# Patient Record
Sex: Male | Born: 1985 | Race: White | Hispanic: No | Marital: Married | Smoking: Never smoker
Health system: Southern US, Community
[De-identification: ages and names within clinical notes are randomized; demographics above are authoritative.]

---

## 2015-12-19 ENCOUNTER — Emergency Department (HOSPITAL_COMMUNITY): Payer: Worker's Compensation

## 2015-12-19 ENCOUNTER — Encounter (HOSPITAL_COMMUNITY): Payer: Self-pay

## 2015-12-19 ENCOUNTER — Emergency Department (HOSPITAL_COMMUNITY)
Admission: EM | Admit: 2015-12-19 | Discharge: 2015-12-19 | Disposition: A | Discharge status: Home / Self Care | Payer: Worker's Compensation | Attending: Emergency Medicine | Admitting: Emergency Medicine

## 2015-12-19 DIAGNOSIS — Y9389 Activity, other specified: Secondary | ICD-10-CM | POA: Insufficient documentation

## 2015-12-19 DIAGNOSIS — S99922A Unspecified injury of left foot, initial encounter: Secondary | ICD-10-CM | POA: Diagnosis present

## 2015-12-19 DIAGNOSIS — S8292XA Unspecified fracture of left lower leg, initial encounter for closed fracture: Secondary | ICD-10-CM

## 2015-12-19 DIAGNOSIS — R52 Pain, unspecified: Secondary | ICD-10-CM

## 2015-12-19 DIAGNOSIS — Y9269 Other specified industrial and construction area as the place of occurrence of the external cause: Secondary | ICD-10-CM | POA: Diagnosis not present

## 2015-12-19 DIAGNOSIS — S92122A Displaced fracture of body of left talus, initial encounter for closed fracture: Secondary | ICD-10-CM | POA: Insufficient documentation

## 2015-12-19 DIAGNOSIS — Y990 Civilian activity done for income or pay: Secondary | ICD-10-CM | POA: Insufficient documentation

## 2015-12-19 DIAGNOSIS — S92252A Displaced fracture of navicular [scaphoid] of left foot, initial encounter for closed fracture: Secondary | ICD-10-CM | POA: Diagnosis not present

## 2015-12-19 MED ORDER — ONDANSETRON HCL 4 MG/2ML IJ SOLN
4.0000 mg | Freq: Once | INTRAMUSCULAR | Status: AC
  Administered 2015-12-19: 4 mg via INTRAVENOUS
  Filled 2015-12-19: qty 2

## 2015-12-19 MED ORDER — OXYCODONE-ACETAMINOPHEN 5-325 MG PO TABS: 1.0000 | ORAL_TABLET | Freq: Three times a day (TID) | ORAL | 0 refills | Status: AC | PRN

## 2015-12-19 MED ORDER — OXYCODONE-ACETAMINOPHEN 5-325 MG PO TABS
1.0000 | ORAL_TABLET | Freq: Once | ORAL | Status: AC
  Administered 2015-12-19: 1 via ORAL
  Filled 2015-12-19: qty 1

## 2015-12-19 MED ORDER — FENTANYL CITRATE (PF) 100 MCG/2ML IJ SOLN
100.0000 ug | Freq: Once | INTRAMUSCULAR | Status: AC
  Administered 2015-12-19: 100 ug via INTRAVENOUS
  Filled 2015-12-19: qty 2

## 2015-12-19 MED ORDER — OXYCODONE-ACETAMINOPHEN 5-325 MG PO TABS
2.0000 | ORAL_TABLET | Freq: Once | ORAL | Status: AC
  Administered 2015-12-19: 2 via ORAL
  Filled 2015-12-19: qty 2

## 2015-12-19 MED ORDER — DIAZEPAM 5 MG/ML IJ SOLN
2.5000 mg | Freq: Once | INTRAMUSCULAR | Status: AC
  Administered 2015-12-19: 2.5 mg via INTRAVENOUS
  Filled 2015-12-19: qty 2

## 2015-12-19 NOTE — ED Provider Notes (Signed)
CT scan reviewed by Dr.Swintek. Requests posterior short-leg splint, crutches, nonweightbearing. Follow up with Dr. Victorino DikeHewitt in one week. I'll write prescription for Percocet. Posterior short leg splint applied by orthopedic technician is comfortable for patient. He'll get Percocet immediately prior to discharge. No results found for this or any previous visit. Dg Tibia/fibula Left  Result Date: 12/19/2015 CLINICAL DATA:  30 y/o  M; ankle injury with pain and swelling. EXAM: LEFT TIBIA AND FIBULA - 2 VIEW COMPARISON:  None. FINDINGS: There is no evidence of fracture of the tibia or fibula. The knee joint is maintained. Soft tissues are unremarkable. IMPRESSION: Negative. Electronically Signed   By: Mitzi HansenLance  Furusawa-Stratton M.D.   On: 12/19/2015 14:35   Ct Foot Left Wo Contrast  Result Date: 12/19/2015 CLINICAL DATA:  Evaluate talus fracture. Foot run over by a Bobcat tractor EXAM: CT OF THE LEFT FOOT WITHOUT CONTRAST TECHNIQUE: Multidetector CT imaging of the left foot was performed according to the standard protocol. Multiplanar CT image reconstructions were also generated. COMPARISON:  Radiographs same date. FINDINGS: Bones/Joint/Cartilage There is a relatively nondisplaced and slightly comminuted fracture involving the medial aspect of the posterior talar facet. There is also a mildly displaced avulsion-type fracture involving the medial aspect of the head of the talus. This does involve the talonavicular joint. Small comminuted fractures involving the inferior and medial most aspect of the navicular bone. Small fracture involving the medial and inferior aspect of the cuboid. The calcaneocuboid joint is intact. The middle and lateral cuneiforms are intact. No definite calcaneal fractures. The talocalcaneal facets are intact. Benign-appearing bone island in the first metatarsal. Ligaments Suboptimally assessed by CT. No obvious disruption of Lisfranc's ligament. Muscles and Tendons Grossly normal. Soft  tissues Moderate subcutaneous soft tissue swelling/ edema associated with the fractures. IMPRESSION: 1. Proximal and distal medial talar fractures. 2. Small comminuted fractures involving the medial and inferior aspect of the navicular bone. 3. Small fracture involving the medial and inferior aspect of the cuboid. 4. No fractures of the tibia or fibula and the calcaneus is intact. Electronically Signed   By: Rudie MeyerP.  Gallerani M.D.   On: 12/19/2015 16:50   Dg Foot Complete Left  Result Date: 12/19/2015 CLINICAL DATA:  30 y/o  M; ankle injury with pain and swelling. EXAM: LEFT FOOT - COMPLETE 3+ VIEW COMPARISON:  None. FINDINGS: Mildly impacted fracture of the medial head of the talus and possible fracture of the anterior articular process of calcaneus. Soft tissue swelling about the mid foot and ankle. Lisfranc alignment is preserved on these nonstress views. IMPRESSION: Mildly impacted fracture medial talus head and possible fracture of the anterior process of calcaneus. Electronically Signed   By: Mitzi HansenLance  Furusawa-Stratton M.D.   On: 12/19/2015 14:39   Diagnosis closed fracture of left lower extremity   Doug SouSam Anupama Piehl, MD 12/19/15 1758

## 2015-12-19 NOTE — Progress Notes (Signed)
Orthopedic Tech Progress Note Patient Details:  Jerry Blackburn 1985-11-14 [id number]  Ortho Devices Type of Ortho Device: Ace wrap, Crutches, Post (short leg) splint, Stirrup splint Ortho Device/Splint Location: LLE Ortho Device/Splint Interventions: Ordered, Application   Jennye MoccasinHughes, Khiry Pasquariello Craig 12/19/2015, 6:02 PM

## 2015-12-19 NOTE — ED Provider Notes (Signed)
Patient reports a "Bobcat" tractor ran over his left foot immediately prior to arrival. Complains of left foot pain no other injury. On exam he is alert no distress left lower extremity skin intact. Tender over ankle. Foot is nontender. DP pulse 2+. Good capillary refill. X-rays viewed by me. Last oral intake 6:30 am today   Doug SouSam Saba Ostrand, MD 12/19/15 1549

## 2015-12-19 NOTE — ED Provider Notes (Signed)
[facility]-EMERGENCY DEPT Provider Note   CSN: [id number] Arrival date & time: 12/19/15  1317     History   Chief Complaint Chief Complaint  Patient presents with  . Ankle Injury    HPI Jerry Blackburn is a 30 y.o. male.  HPI   Patient is a 30 year old male, otherwise healthy, who presents emergency Department with a left foot and ankle injury that occurred just prior to arrival. 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 He was unable to ambulate, did not attempt to bare weight, his boot was cut off, no deformity, no laceration. He reports pain and swelling to his medial left foot and ankle, which has gradually worsened, currently pain rated 8/10, described as throbbing. Pain was relatively improved with fentanyl, given in route by EMS. Other treatments attempted prior to arrival. He arrived with a soft foam splint on his left ankle and foot. He denies any numbness or tingling. States he can feel and move his toes.  Patient denies any other injuries or acute complaints.  History reviewed. No pertinent past medical history.  There are no active problems to display for this patient.   History reviewed. No pertinent surgical history.     Home Medications    Prior to Admission medications   Not on File    Family History History reviewed. No pertinent family history.  Social History Social History  Substance Use Topics  . Smoking status: Never Smoker  . Smokeless tobacco: Never Used  . Alcohol use Yes     Comment: occ on weekends     Allergies   Review of patient's allergies indicates no known allergies.   Review of Systems Review of Systems  All other systems reviewed and are negative.    Physical Exam Updated Vital Signs BP 143/87 (BP Location: Left Arm)   Pulse 80   Temp 98 F (36.7 C) (Oral)   Resp 16   Ht 5\' 11"  (1.803 m)   Wt 102.1 kg   SpO2 97%   BMI  31.38 kg/m   Physical Exam  Constitutional: He is oriented to person, place, and time. He appears well-developed and well-nourished. No distress.  HENT:  Head: Normocephalic and atraumatic.  Right Ear: External ear normal.  Left Ear: External ear normal.  Nose: Nose normal.  Mouth/Throat: Oropharynx is clear and moist.  Eyes: Conjunctivae are normal. Pupils are equal, round, and reactive to light. Right eye exhibits no discharge. Left eye exhibits no discharge. No scleral icterus.  Neck: Normal range of motion.  Cardiovascular: Normal rate, regular rhythm and intact distal pulses.   Pulmonary/Chest: Effort normal and breath sounds normal. No stridor. No respiratory distress.  Musculoskeletal: He exhibits edema and tenderness. He exhibits no deformity.       Left ankle: He exhibits decreased range of motion and swelling. He exhibits no deformity, no laceration and normal pulse. Tenderness. Achilles tendon normal.       Left foot: There is normal capillary refill, no deformity and no laceration.       Feet:  Left foot with 2+ dorsal pedis pulse is, posterior tibialis pulses palpable but slightly diminished with edema  Neurological: He is alert and oriented to person, place, and time. He exhibits normal muscle tone. Coordination normal.  Left foot and toes normal sensation to light touch  Skin: Skin is warm and dry. Capillary refill takes less than  2 seconds. No rash noted. He is not diaphoretic. No erythema. No pallor.  Psychiatric: He has a normal mood and affect. His behavior is normal. Judgment and thought content normal.  Nursing note and vitals reviewed.    ED Treatments / Results  Labs (all labs ordered are listed, but only abnormal results are displayed) Labs Reviewed - No data to display  EKG  EKG Interpretation None       Radiology Dg Tibia/fibula Left  Result Date: 12/19/2015 CLINICAL DATA:  30 y/o  M; ankle injury with pain and swelling. EXAM: LEFT TIBIA AND  FIBULA - 2 VIEW COMPARISON:  None. FINDINGS: There is no evidence of fracture of the tibia or fibula. The knee joint is maintained. Soft tissues are unremarkable. IMPRESSION: Negative. Electronically Signed   By: Mitzi HansenLance  Furusawa-Stratton M.D.   On: 12/19/2015 14:35   Dg Foot Complete Left  Result Date: 12/19/2015 CLINICAL DATA:  30 y/o  M; ankle injury with pain and swelling. EXAM: LEFT FOOT - COMPLETE 3+ VIEW COMPARISON:  None. FINDINGS: Mildly impacted fracture of the medial head of the talus and possible fracture of the anterior articular process of calcaneus. Soft tissue swelling about the mid foot and ankle. Lisfranc alignment is preserved on these nonstress views. IMPRESSION: Mildly impacted fracture medial talus head and possible fracture of the anterior process of calcaneus. Electronically Signed   By: Mitzi HansenLance  Furusawa-Stratton M.D.   On: 12/19/2015 14:39    Procedures Procedures (including critical care time)  Medications Ordered in ED Medications  oxyCODONE-acetaminophen (PERCOCET/ROXICET) 5-325 MG per tablet 2 tablet (not administered)  ondansetron (ZOFRAN) injection 4 mg (not administered)  fentaNYL (SUBLIMAZE) injection 100 mcg (100 mcg Intravenous Given 12/19/15 1338)  diazepam (VALIUM) injection 2.5 mg (2.5 mg Intravenous Given 12/19/15 1338)     Initial Impression / Assessment and Plan / ED Course  I have reviewed the triage vital signs and the nursing notes.  Pertinent labs & imaging results that were available during my care of the patient were reviewed by me and considered in my medical decision making (see chart for details).  Clinical Course  Left foot and ankle injury, mechanism was off Tractor reversing up onto the patient's foot and leg. It was removed, he was splinted and given fentanyl by EMS.  No obvious deformity, pulses and sensation intact. X-ray significant for mildly impacted fracture of the medial talus had a possible fracture of anterior process of calcaneus,  Lisfranc alignment preserved on images.  Extremity elevated and iced. Imaging and case reviewed with Dr. Deretha EmoryZackowski, who advises ortho consult.  Ortho consulted, Dr. Linna CapriceSwinteck req ct left foot, and return call with results.  Pt updated on plan, oral pain meds given with zofran.    Pt handed off to Dr. Sheliah MendsJaubowitz with CT pending, and splinting and dispo per Ortho    Final Clinical Impressions(s) / ED Diagnoses   Final diagnoses:  None    New Prescriptions New Prescriptions   No medications on file     Danelle BerryLeisa Adriene Padula, PA-C 01/10/16 0055    Vanetta MuldersScott Zackowski, MD 01/11/16 1549

## 2015-12-19 NOTE — Discharge Instructions (Signed)
Take Tylenol for mild pain or the pain medicine prescribed for bad pain. Don't take Tylenol together with the pain medicine prescribed as the combination can be dangerous to your liver. Call Dr. Victorino DikeHewitt or the orthopedic physician of your choice tomorrow to arrange to be seen within a week. Use crutches and no weight bearing on left foot. Elevate your foot above your heart as much as possible.You can hold an ice pack over the splint for times daily for 30 minutes at a time to help with pain and swelling

## 2015-12-19 NOTE — ED Triage Notes (Signed)
Per EMS - pt at work, bobcat tread rolled over left ankle/foot. PMS intact. No other injuries/complaints. Given 150 fentanyl w/ relief. No medical hx. Unknown last tetanus shot.

## 2017-05-10 IMAGING — CT CT FOOT*L* W/O CM
2 of 3 series · 7 of 33 positions shown, 8 images · non-contrast
Comparison: Radiographs same date.

CLINICAL DATA: Evaluate talus fracture. Foot run over by [REDACTED]
tractor

EXAM:
CT OF THE LEFT FOOT WITHOUT CONTRAST
TECHNIQUE: Multidetector CT imaging of the left foot was performed according to
the standard protocol. Multiplanar CT image reconstructions were
also generated.

[Series 302: soft tissue · axial · 0.39mm/px · z∈[-898,-720]mm · 4 of 129 slices shown, 5 images]
[im 20/129  soft-tissue]
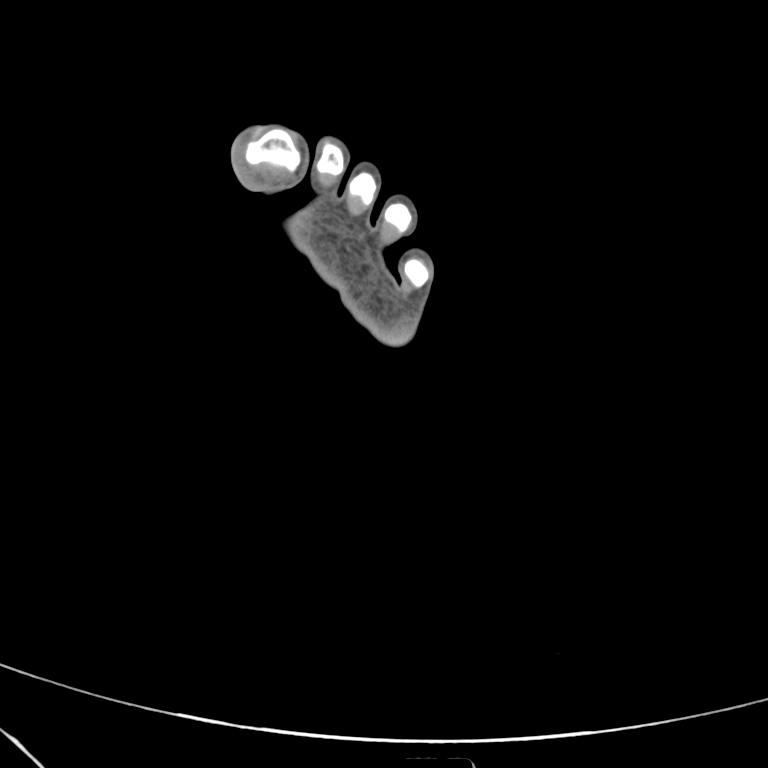
[im 20/129  bone]
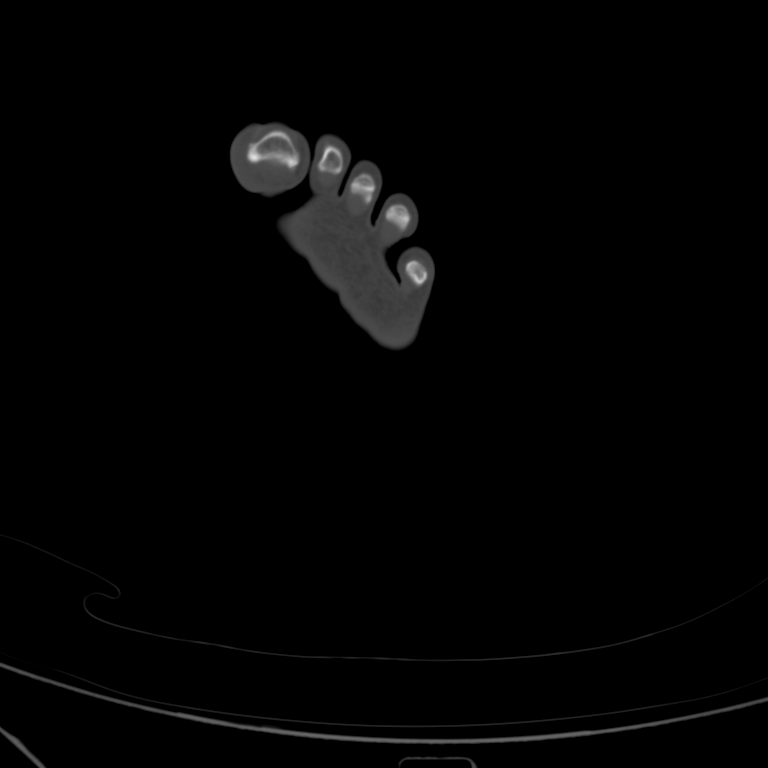
[im 50/129  bone]
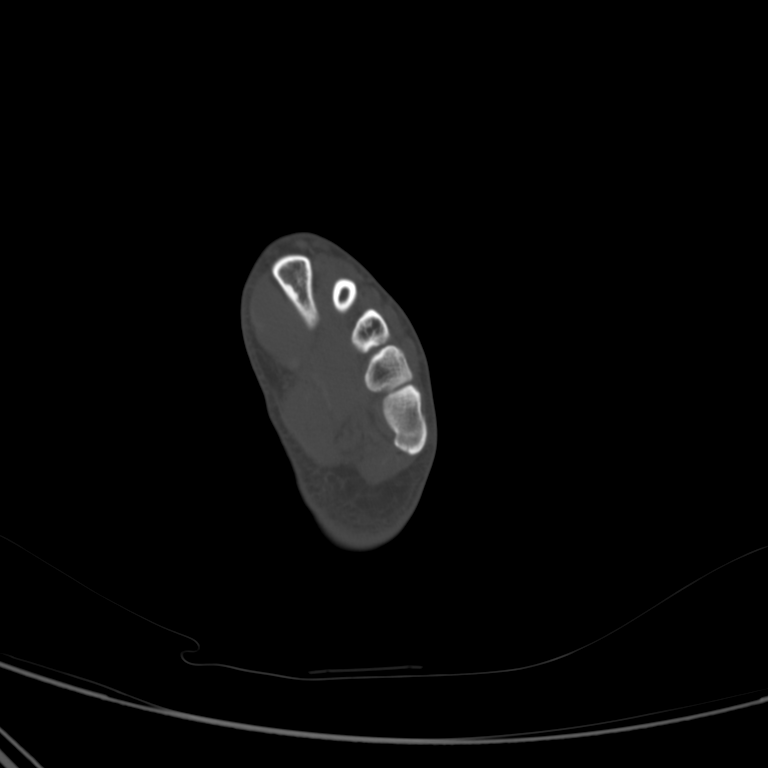
[im 79/129  bone]
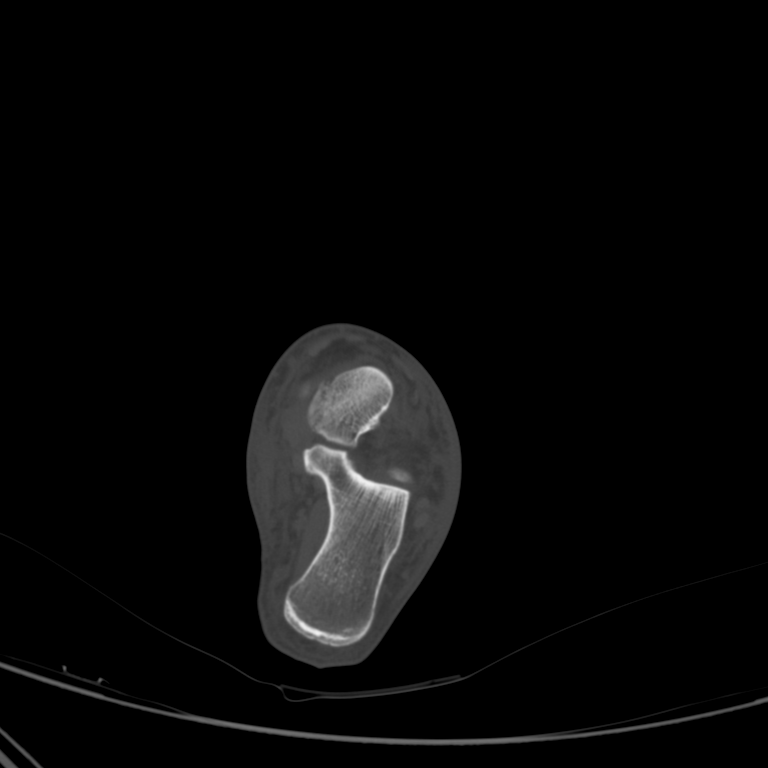
[im 109/129  bone]
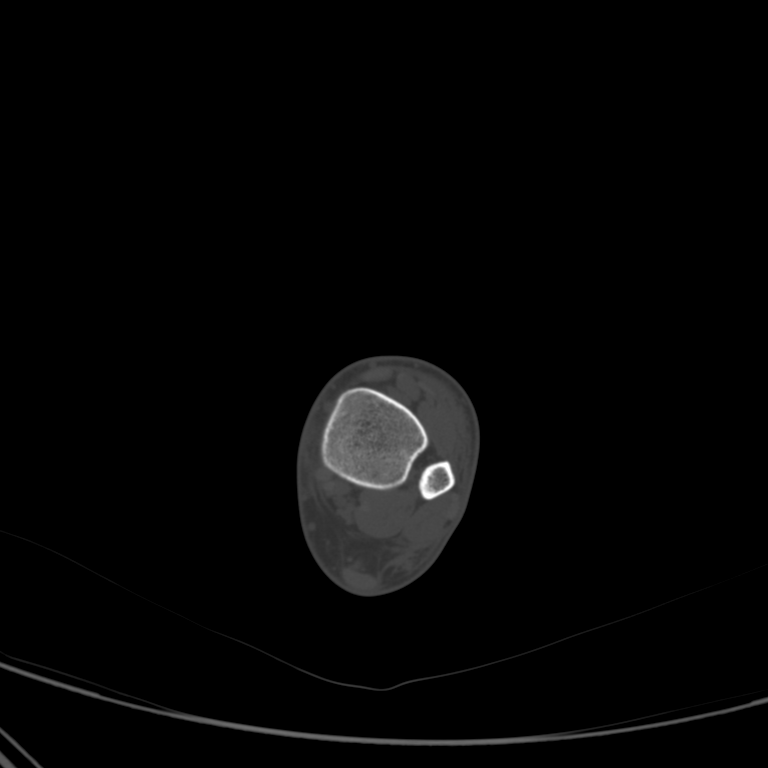

[Series 305: coronal soft · coronal · 0.34mm/px · 3 of 131 slices shown]
[im 27/131  bone]
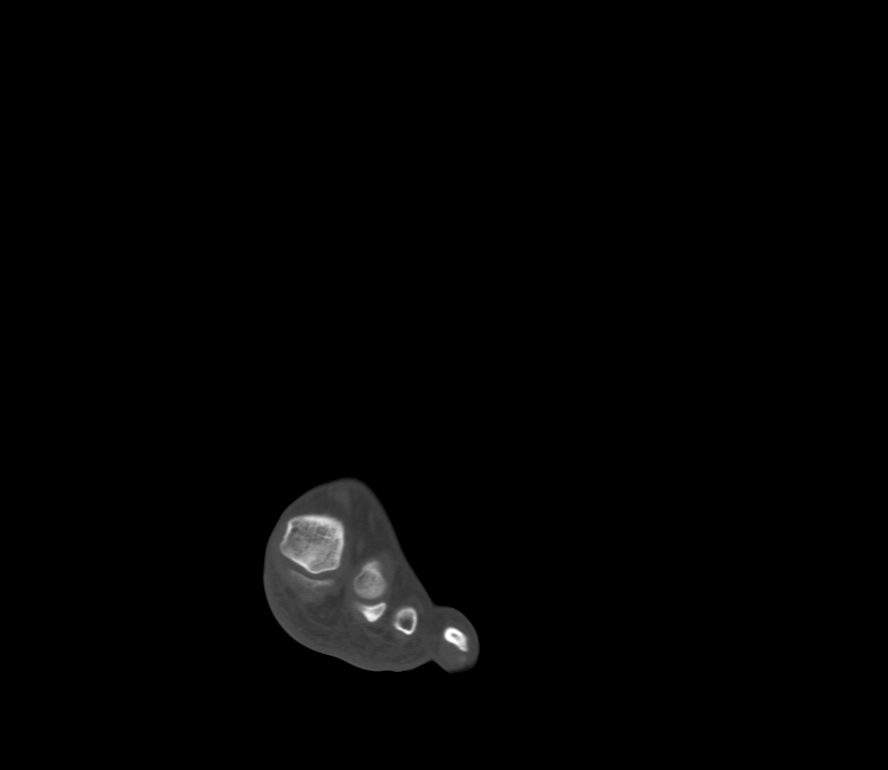
[im 53/131  bone]
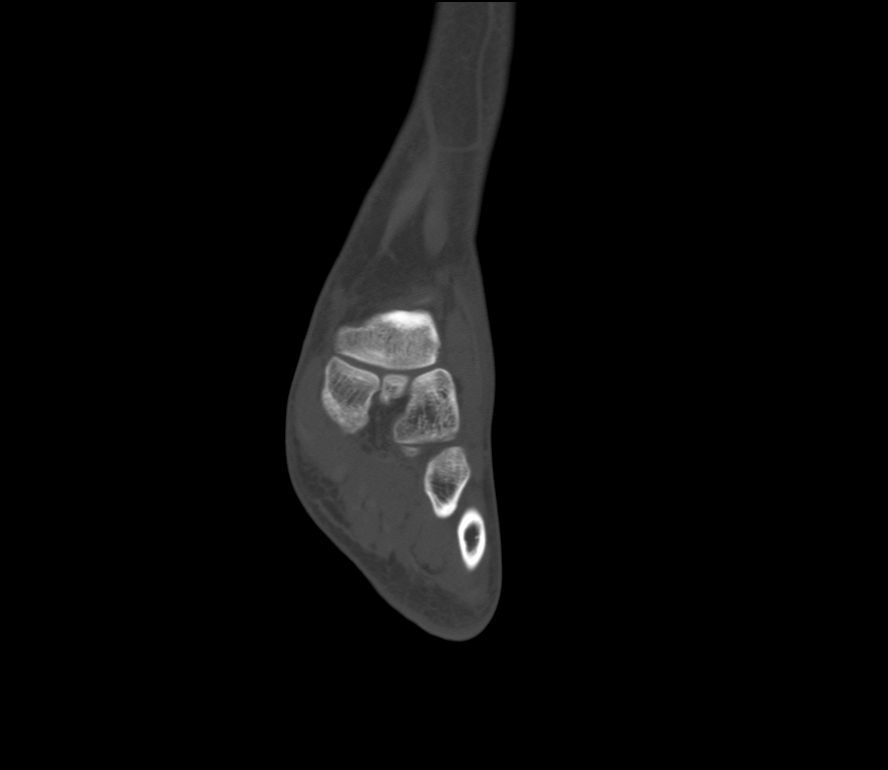
[im 79/131  bone]
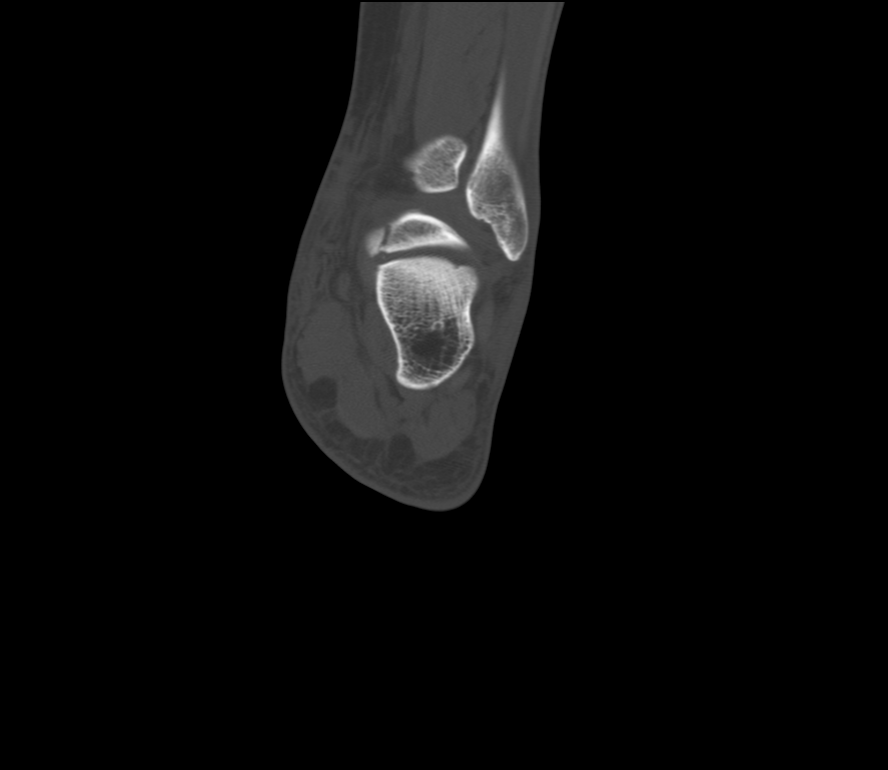

[7 of 33 positions shown; findings below may reference images not displayed]

FINDINGS: Bones/Joint/Cartilage

There is a relatively nondisplaced and slightly comminuted fracture
involving the medial aspect of the posterior talar facet. There is
also a mildly displaced avulsion-type fracture involving the medial
aspect of the head of the talus. This does involve the talonavicular
joint.

Small comminuted fractures involving the inferior and medial most
aspect of the navicular bone.

Small fracture involving the medial and inferior aspect of the
cuboid. The calcaneocuboid joint is intact. The middle and lateral
cuneiforms are intact.

No definite calcaneal fractures.

The talocalcaneal facets are intact.

Benign-appearing bone island in the first metatarsal.

Ligaments

Suboptimally assessed by CT. No obvious disruption of Lisfranc's
ligament.

Muscles and Tendons

Grossly normal.

Soft tissues

Moderate subcutaneous soft tissue swelling/ edema associated with
the fractures.
IMPRESSION: 1. Proximal and distal medial talar fractures.
2. Small comminuted fractures involving the medial and inferior
aspect of the navicular bone.
3. Small fracture involving the medial and inferior aspect of the
cuboid.
4. No fractures of the tibia or fibula and the calcaneus is intact.

## 2017-05-10 IMAGING — DX DG FOOT COMPLETE 3+V*L*
3 series · 3 of 3 positions shown · non-contrast
Comparison: None.

CLINICAL DATA: 30 y/o  M; ankle injury with pain and swelling.

EXAM:
LEFT FOOT - COMPLETE 3+ VIEW

[foot ap]
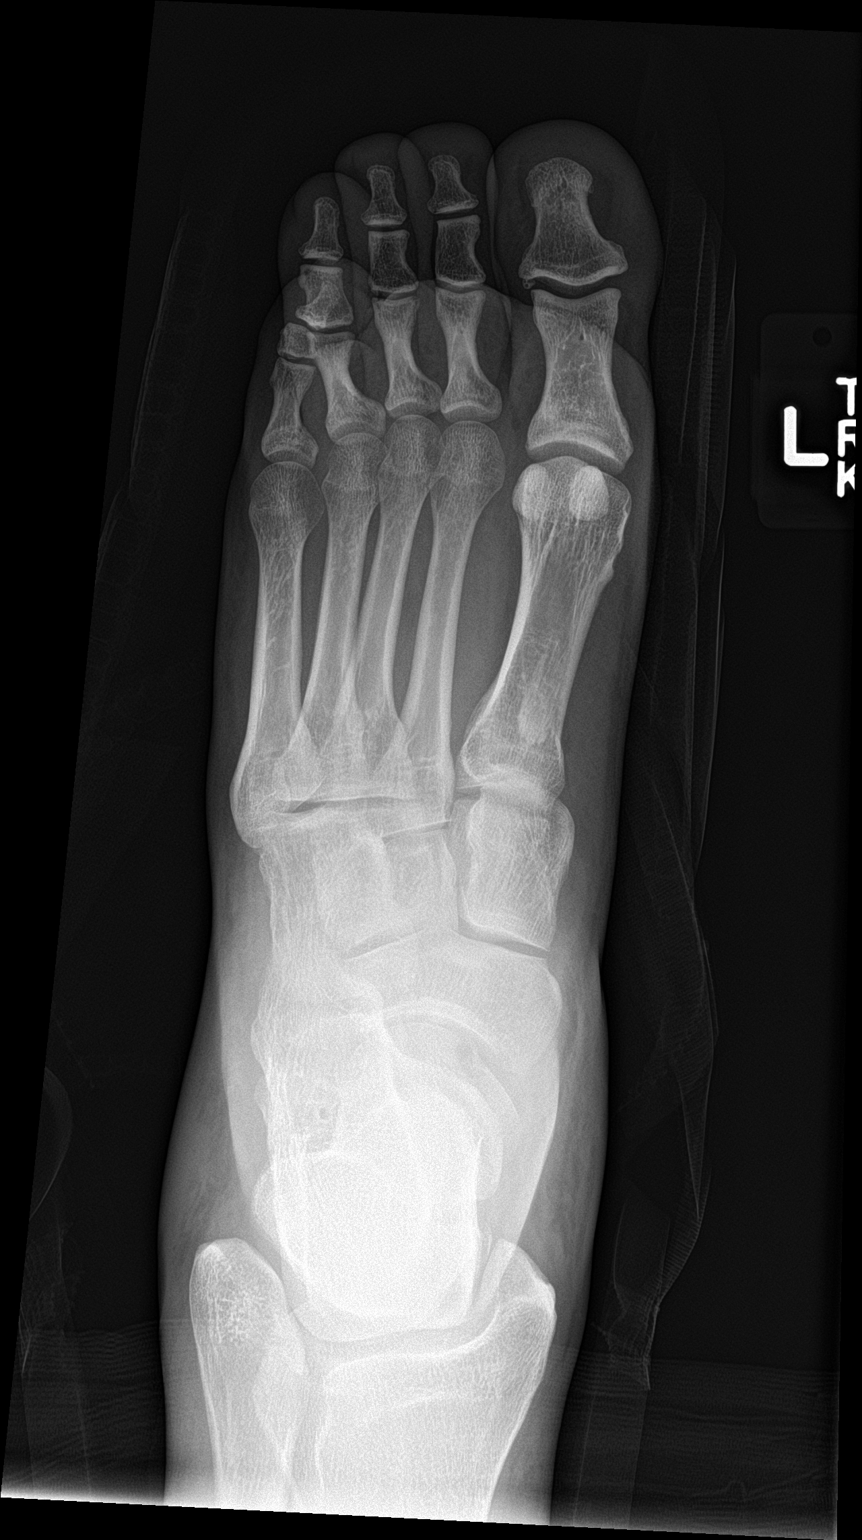

[foot obl]
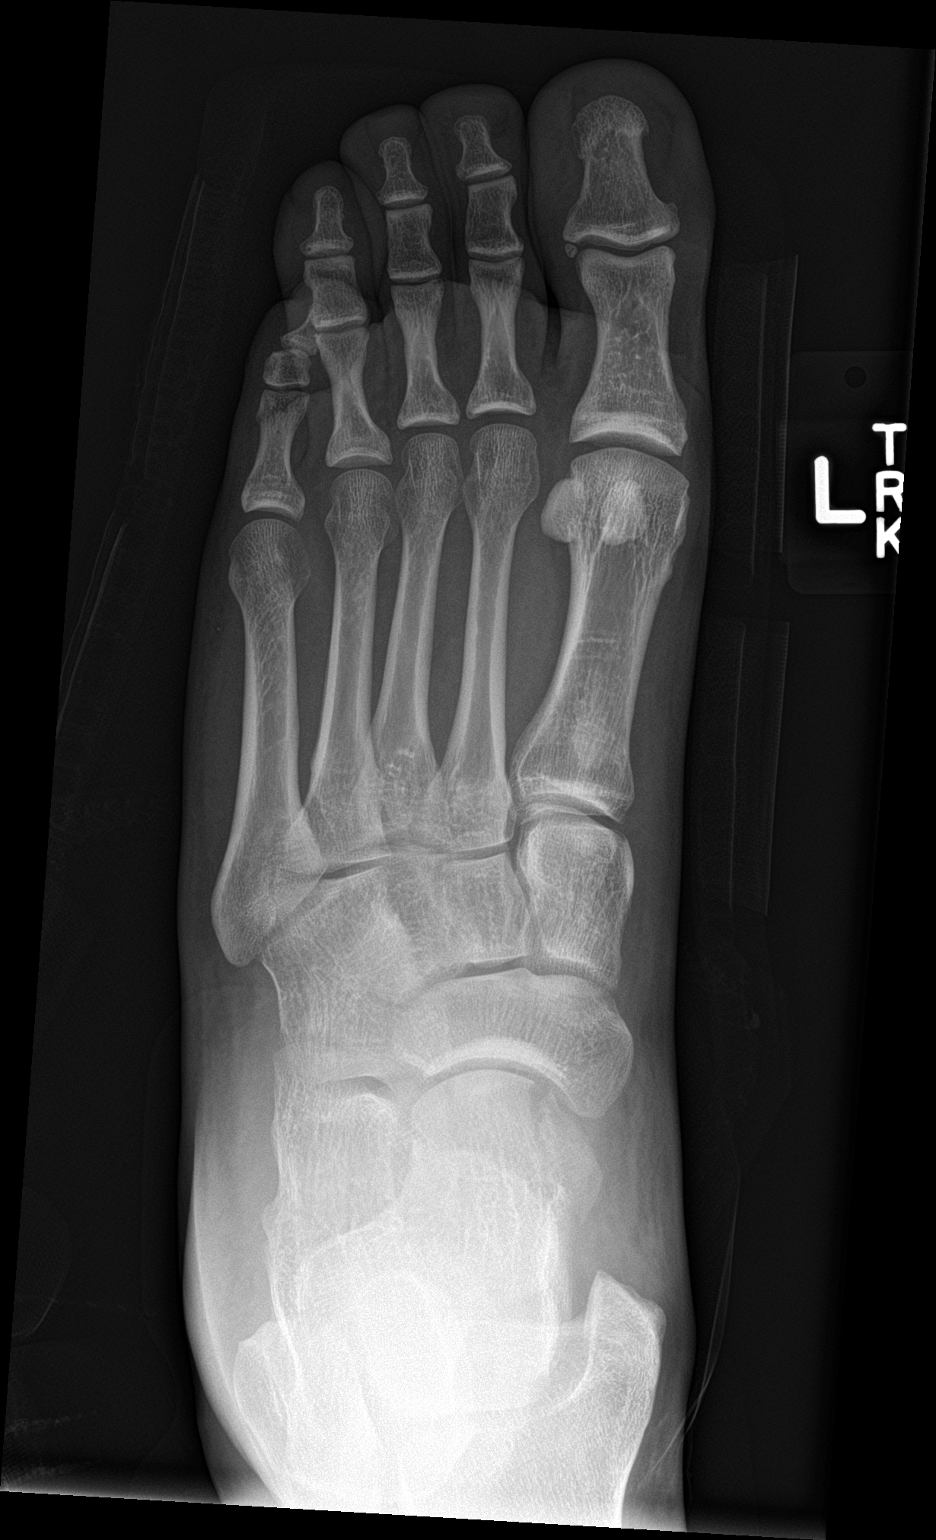

[foot lat]
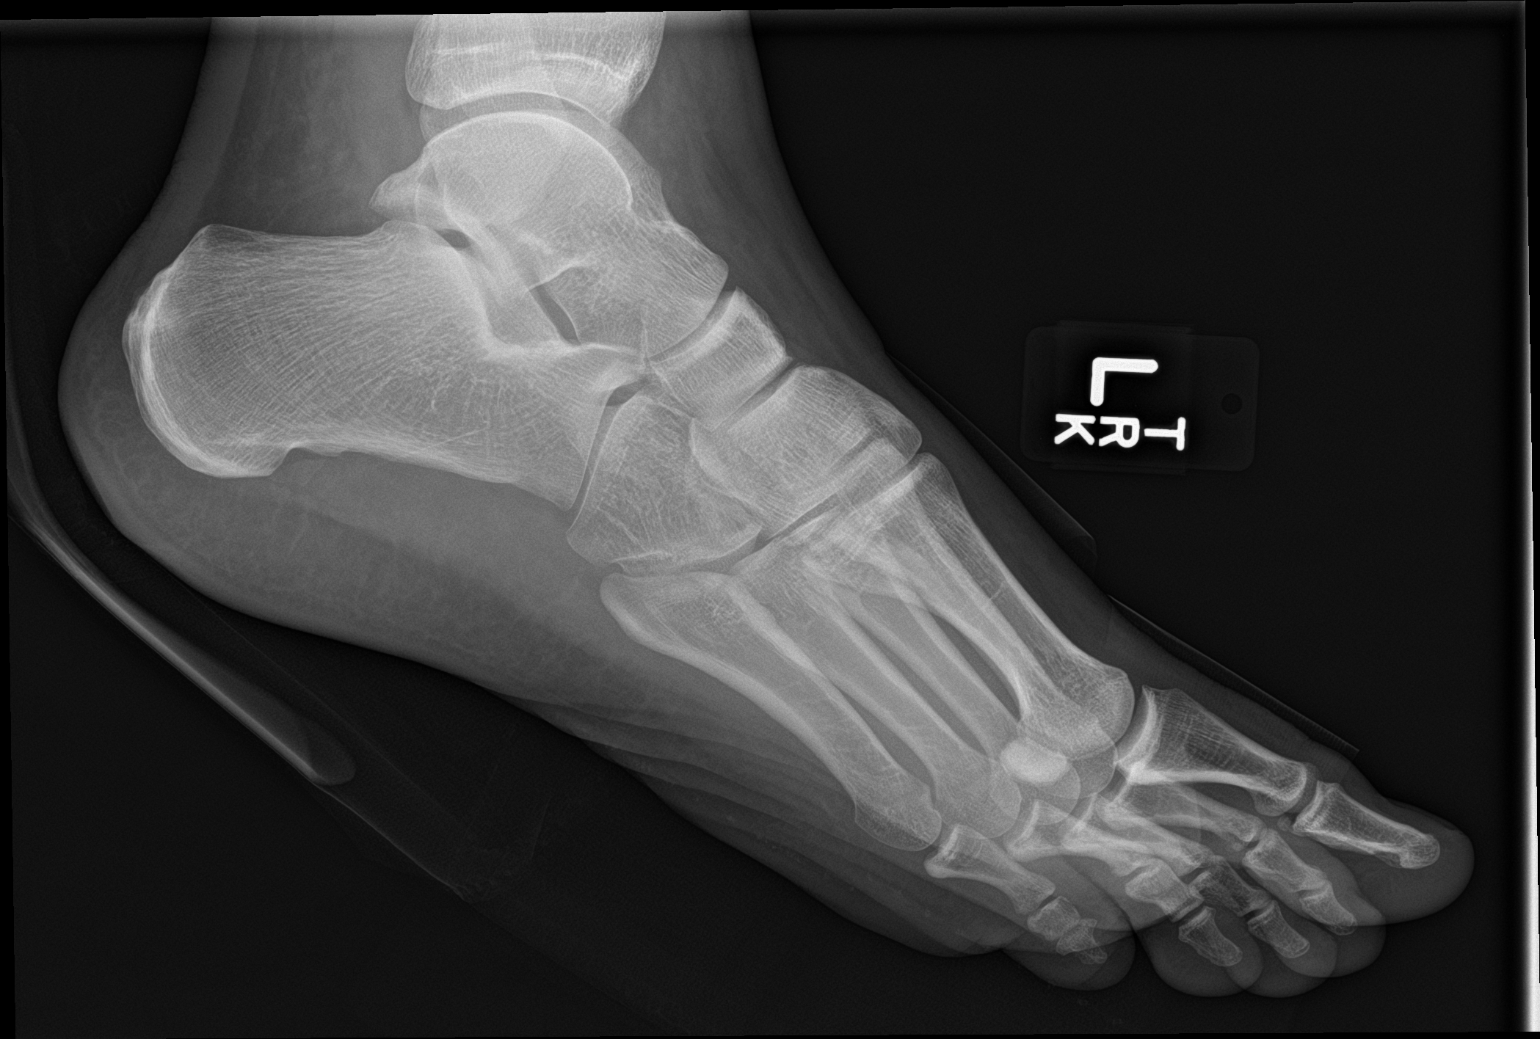

[3 of 3 positions shown; findings below may reference images not displayed]

FINDINGS: Mildly impacted fracture of the medial head of the talus and
possible fracture of the anterior articular process of calcaneus.
Soft tissue swelling about the mid foot and ankle. Lisfranc
alignment is preserved on these nonstress views.
IMPRESSION: Mildly impacted fracture medial talus head and possible fracture of
the anterior process of calcaneus.

By: Che Wah Billie M.D.
